# Patient Record
Sex: Female | Born: 1965 | Race: White | Hispanic: No | Marital: Married | Smoking: Never smoker
Health system: Southern US, Community
[De-identification: ages and names within clinical notes are randomized; demographics above are authoritative.]

## PROBLEM LIST (undated history)

## (undated) DIAGNOSIS — I10 Essential (primary) hypertension: Secondary | ICD-10-CM

---

## 2003-12-18 ENCOUNTER — Observation Stay: Payer: Self-pay

## 2003-12-27 ENCOUNTER — Inpatient Hospital Stay: Payer: Self-pay | Admitting: Obstetrics & Gynecology

## 2004-01-11 ENCOUNTER — Observation Stay: Payer: Self-pay

## 2004-01-12 ENCOUNTER — Ambulatory Visit (HOSPITAL_COMMUNITY): Admission: RE | Admit: 2004-01-12 | Discharge: 2004-01-12 | Payer: Self-pay | Admitting: Family Medicine

## 2005-01-22 ENCOUNTER — Ambulatory Visit: Payer: Self-pay

## 2006-01-29 ENCOUNTER — Ambulatory Visit: Payer: Self-pay

## 2006-12-11 ENCOUNTER — Ambulatory Visit: Payer: Self-pay | Admitting: Family Medicine

## 2008-02-06 ENCOUNTER — Emergency Department: Payer: Self-pay | Admitting: Emergency Medicine

## 2009-01-29 ENCOUNTER — Ambulatory Visit: Payer: Self-pay | Admitting: Family Medicine

## 2009-02-01 ENCOUNTER — Ambulatory Visit: Payer: Self-pay | Admitting: Family Medicine

## 2011-01-28 IMAGING — MG MMM DGTL SCREENING MAMMO W/CAD
1 series · 4 of 4 positions shown · non-contrast
Comparison: none

REASON FOR EXAM: scr mammo
COMMENTS:

[Series 591: R CC · right · 4 of 4 slices shown]
[im 1/4]
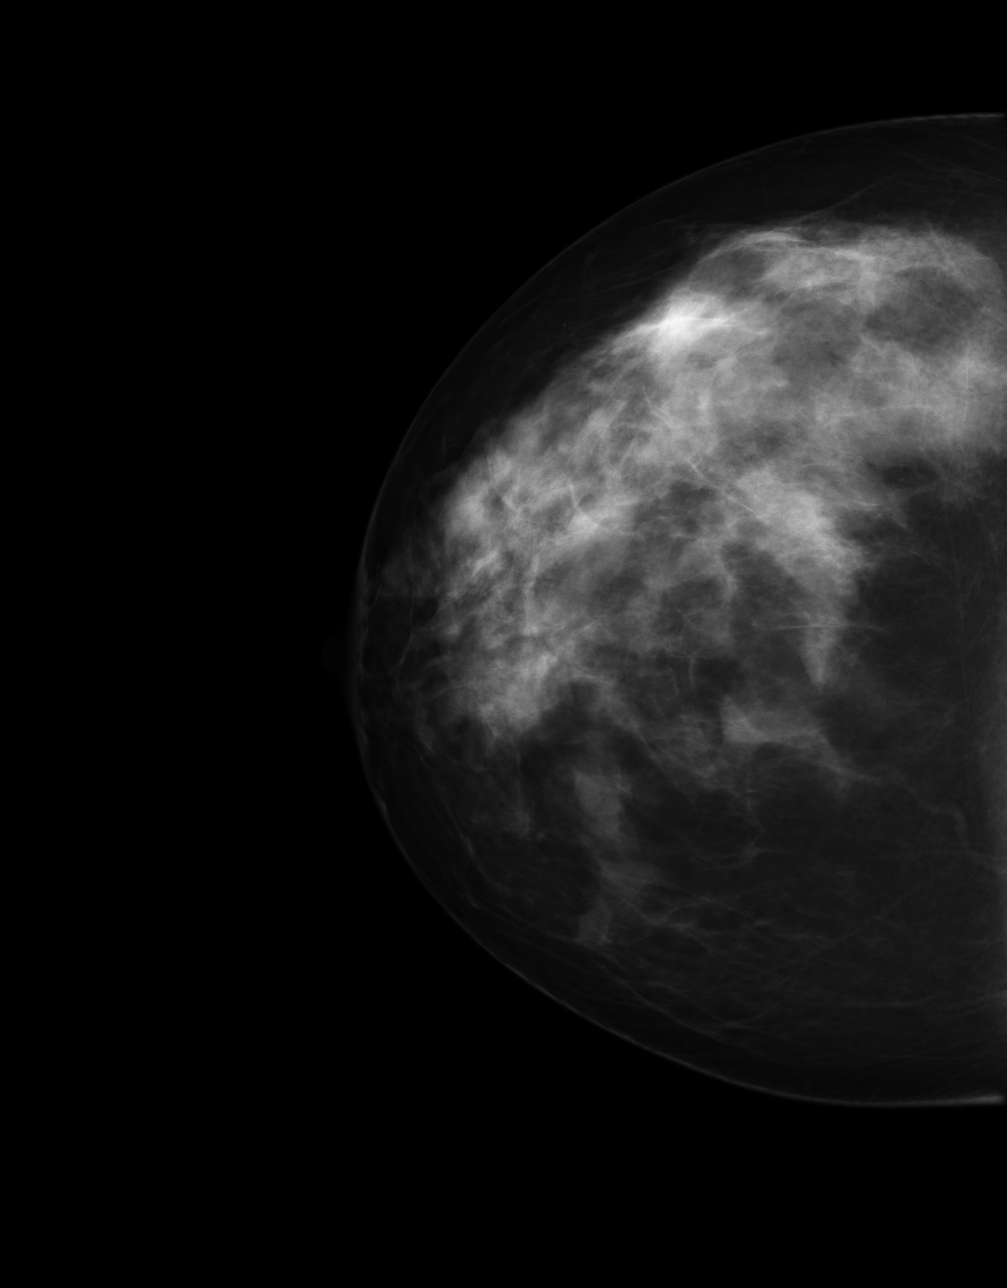
[im 2/4]
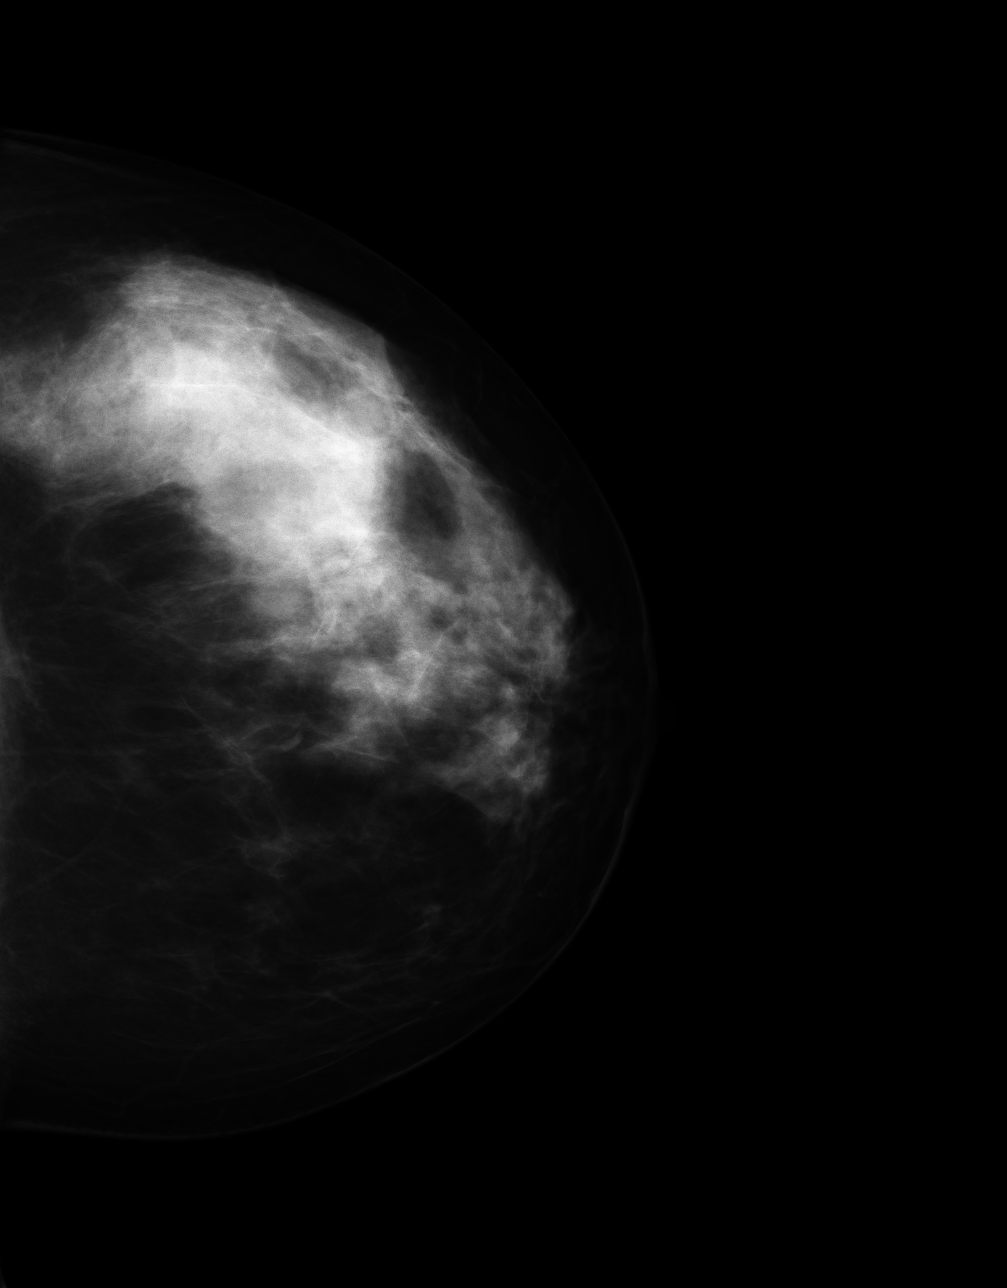
[im 3/4]
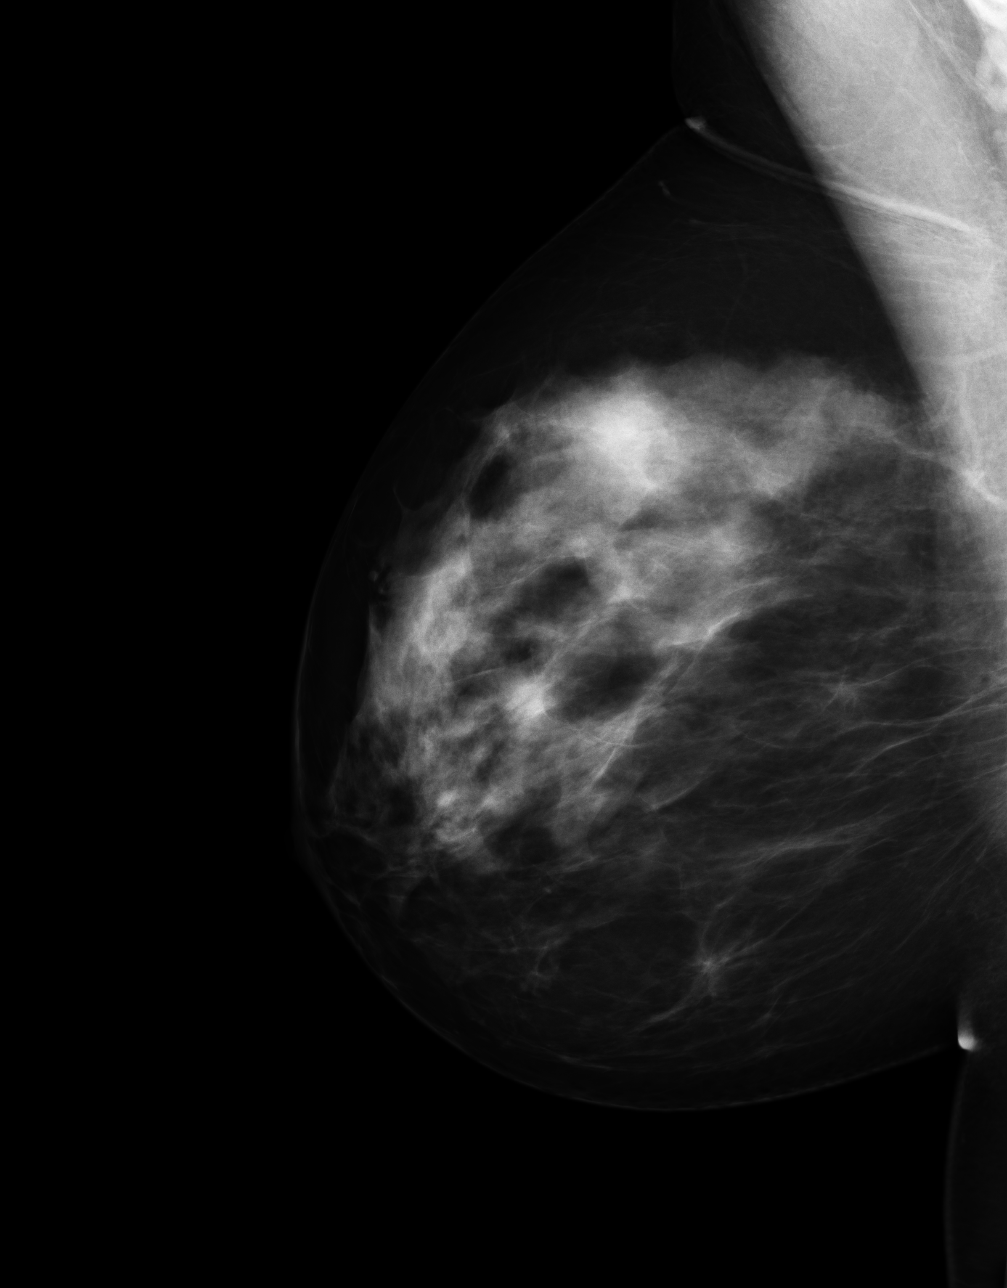
[im 4/4]
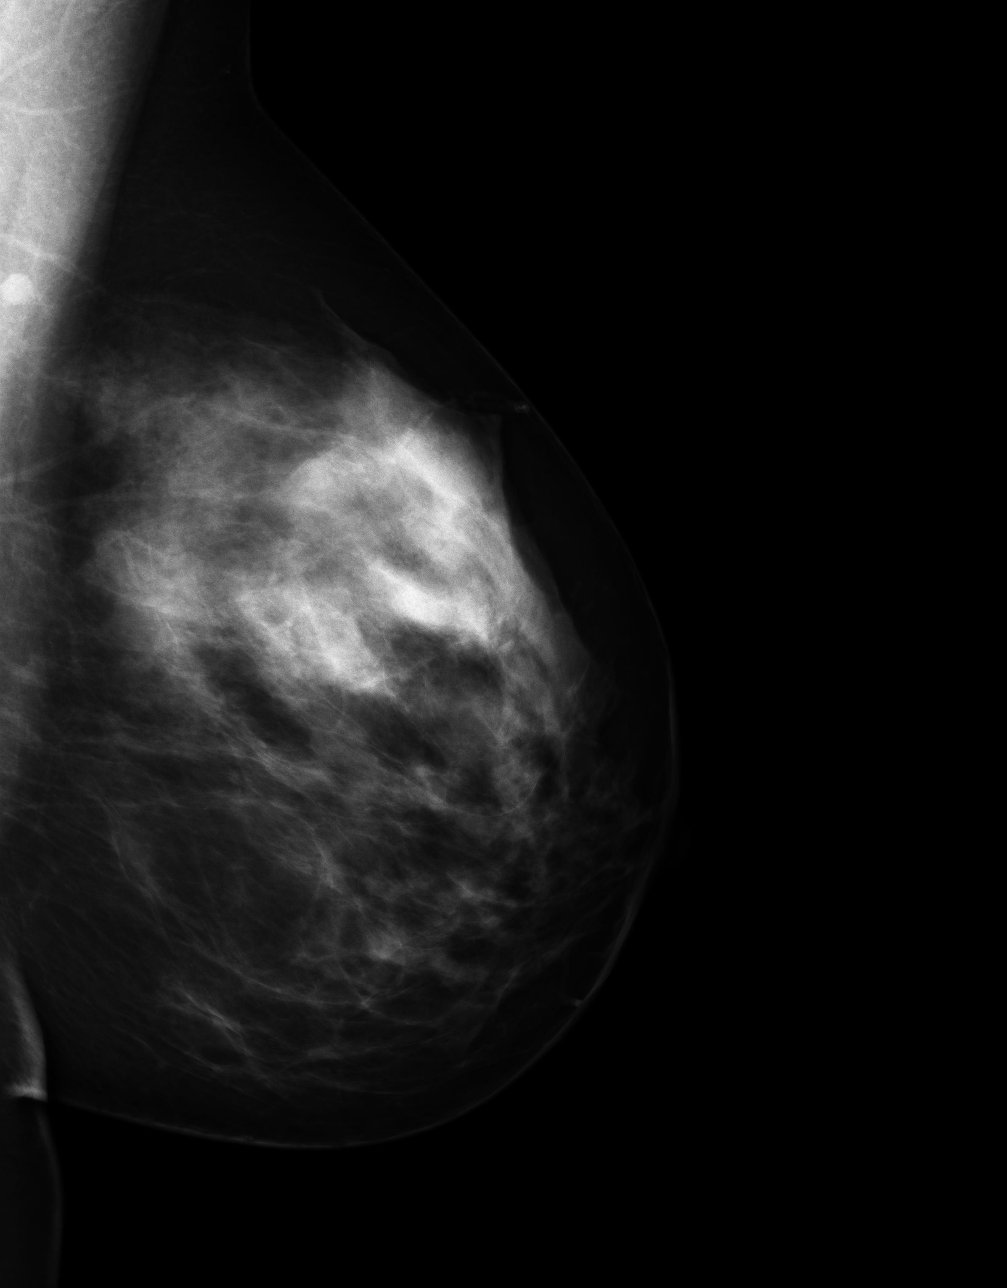

[4 of 4 positions shown; findings below may reference images not displayed]

PROCEDURE:     MMM - MMM DGTL SCREENING MAMMO W/CAD  - January 29, 2009  [DATE]

RESULT:     Comparison is made to a study of 01/29/2006 and 01/22/2005.

The breasts exhibit a dense parenchymal pattern. There is no dominant mass.
I see no malignant appearing groupings of microcalcification. On the right,
there is an area of nodularity that is more conspicuous than in the past,
demonstrated best on the MLO view. This is likely related to changes in
positioning but further evaluation will be needed.
IMPRESSION: I do not see findings highly suspicious for malignancy.
However, there is nodularity present demonstrated best on the MLO view
likely along the equator of the right breast which merits further
evaluation. The areas in question have been marked electronically and the
images saved.

BI-RADS:  Category 0 - Needs Additional Imaging Evaluation. Additional
work-up  of the right breast with supplemental spot compression views and a
true lateral film is recommended. Ultrasound may be indicated if nodularity
persists.

Thank you for this opportunity to contribute to the care of your patient.

A NEGATIVE MAMMOGRAM REPORT DOES NOT PRECLUDE BIOPSY OR OTHER EVALUATION OF
A CLINICALLY PALPABLE OR OTHERWISE SUSPICIOUS MASS OR LESION. BREAST CANCER
MAY NOT BE DETECTED BY MAMMOGRAPHY IN UP TO 10% OF CASES.

## 2013-05-28 ENCOUNTER — Ambulatory Visit: Payer: Self-pay | Admitting: Family Medicine

## 2014-07-12 ENCOUNTER — Encounter: Payer: Self-pay | Admitting: Family Medicine

## 2014-07-12 ENCOUNTER — Ambulatory Visit
Admission: EM | Admit: 2014-07-12 | Discharge: 2014-07-12 | Disposition: A | Discharge status: Home / Self Care | Payer: BC Managed Care – PPO | Attending: Family Medicine | Admitting: Family Medicine

## 2014-07-12 DIAGNOSIS — H8110 Benign paroxysmal vertigo, unspecified ear: Secondary | ICD-10-CM | POA: Diagnosis not present

## 2014-07-12 HISTORY — DX: Essential (primary) hypertension: I10

## 2014-07-12 NOTE — Discharge Instructions (Signed)
Epley Maneuver Self-Care WHAT IS THE EPLEY MANEUVER? The Epley maneuver is an exercise you can do to relieve symptoms of benign paroxysmal positional vertigo (BPPV). This condition is often just referred to as vertigo. BPPV is caused by the movement of tiny crystals (canaliths) inside your inner ear. The accumulation and movement of canaliths in your inner ear causes a sudden spinning sensation (vertigo) when you move your head to certain positions. Vertigo usually lasts about 30 seconds. BPPV usually occurs in just one ear. If you get vertigo when you lie on your left side, you probably have BPPV in your left ear. Your health care provider can tell you which ear is involved.  BPPV may be caused by a head injury. Many people older than 50 get BPPV for unknown reasons. If you have been diagnosed with BPPV, your health care provider may teach you how to do this maneuver. BPPV is not life threatening (benign) and usually goes away in time.  WHEN SHOULD I PERFORM THE EPLEY MANEUVER? You can do this maneuver at home whenever you have symptoms of vertigo. You may do the Epley maneuver up to 3 times a day until your symptoms of vertigo go away. HOW SHOULD I DO THE EPLEY MANEUVER?  Sit on the edge of a bed or table with your back straight. Your legs should be extended or hanging over the edge of the bed or table.   Turn your head halfway toward the affected ear.   Lie backward quickly with your head turned until you are lying flat on your back. You may want to position a pillow under your shoulders.   Hold this position for 30 seconds. You may experience an attack of vertigo. This is normal. Hold this position until the vertigo stops.  Then turn your head to the opposite direction until your unaffected ear is facing the floor.   Hold this position for 30 seconds. You may experience an attack of vertigo. This is normal. Hold this position until the vertigo stops.  Now turn your whole body to the same  side as your head. Hold for another 30 seconds.   You can then sit back up. ARE THERE RISKS TO THIS MANEUVER? In some cases, you may have other symptoms (such as changes in your vision, weakness, or numbness). If you have these symptoms, stop doing the maneuver and call your health care provider. Even if doing these maneuvers relieves your vertigo, you may still have dizziness. Dizziness is the sensation of light-headedness but without the sensation of movement. Even though the Epley maneuver may relieve your vertigo, it is possible that your symptoms will return within 5 years. WHAT SHOULD I DO AFTER THIS MANEUVER? After doing the Epley maneuver, you can return to your normal activities. Ask your doctor if there is anything you should do at home to prevent vertigo. This may include:  Sleeping with two or more pillows to keep your head elevated.  Not sleeping on the side of your affected ear.  Getting up slowly from bed.  Avoiding sudden movements during the day.  Avoiding extreme head movement, like looking up or bending over.  Wearing a cervical collar to prevent sudden head movements. WHAT SHOULD I DO IF MY SYMPTOMS GET WORSE? Call your health care provider if your vertigo gets worse. Call your provider right way if you have other symptoms, including:   Nausea.  Vomiting.  Headache.  Weakness.  Numbness.  Vision changes. Document Released: 02/22/2013 Document Reviewed: 02/22/2013 ExitCare   Patient Information 2015 [employer]. This information is not intended to replace advice given to you by your health care provider. Make sure you discuss any questions you have with your health care provider.   Benign Positional Vertigo Vertigo means you feel like you or your surroundings are moving when they are not. Benign positional vertigo is the most common form of vertigo. Benign means that the cause of your condition is not serious. Benign positional vertigo is more common in  older adults. CAUSES  Benign positional vertigo is the result of an upset in the labyrinth system. This is an area in the middle ear that helps control your balance. This may be caused by a viral infection, head injury, or repetitive motion. However, often no specific cause is found. SYMPTOMS  Symptoms of benign positional vertigo occur when you move your head or eyes in different directions. Some of the symptoms may include:  Loss of balance and falls.  Vomiting.  Blurred vision.  Dizziness.  Nausea.  Involuntary eye movements (nystagmus). DIAGNOSIS  Benign positional vertigo is usually diagnosed by physical exam. If the specific cause of your benign positional vertigo is unknown, your caregiver may perform imaging tests, such as magnetic resonance imaging (MRI) or computed tomography (CT). TREATMENT  Your caregiver may recommend movements or procedures to correct the benign positional vertigo. Medicines such as meclizine, benzodiazepines, and medicines for nausea may be used to treat your symptoms. In rare cases, if your symptoms are caused by certain conditions that affect the inner ear, you may need surgery. HOME CARE INSTRUCTIONS   Follow your caregiver's instructions.  Move slowly. Do not make sudden body or head movements.  Avoid driving.  Avoid operating heavy machinery.  Avoid performing any tasks that would be dangerous to you or others during a vertigo episode.  Drink enough fluids to keep your urine clear or pale yellow. SEEK IMMEDIATE MEDICAL CARE IF:   You develop problems with walking, weakness, numbness, or using your arms, hands, or legs.  You have difficulty speaking.  You develop severe headaches.  Your nausea or vomiting continues or gets worse.  You develop visual changes.  Your family or friends notice any behavioral changes.  Your condition gets worse.  You have a fever.  You develop a stiff neck or sensitivity to light. MAKE SURE YOU:    Understand these instructions.  Will watch your condition.  Will get help right away if you are not doing well or get worse. Document Released: 11/25/2005 Document Revised: 05/12/2011 Document Reviewed: 11/07/2010 ExitCare Patient Information 2015 [employer]. This information is not intended to replace advice given to you by your health care provider. Make sure you discuss any questions you have with your health care provider.  

## 2014-07-12 NOTE — ED Provider Notes (Signed)
CSN: [id number]     Arrival date & time 07/12/14  1428 History   First MD Initiated Contact with Patient 07/12/14 1529     Chief Complaint  Patient presents with  . Dizziness    today  . Nausea    today   (Consider location/radiation/quality/duration/timing/severity/associated sxs/prior Treatment) Patient is a 49 y.o. female presenting with dizziness. The history is provided by the patient.  Dizziness Quality:  Vertigo and lightheadedness Severity:  Mild Onset quality:  Sudden (sudden onset of dizziness today at work; with with movement) Chronicity:  New Context: bending over and head movement   Context: not with bowel movement, not with ear pain, not with eye movement, not with inactivity, not with loss of consciousness, not with medication, not with physical activity, not when standing up and not when urinating   Worsened by:  Movement and turning head Ineffective treatments:  None tried Associated symptoms: vomiting   Associated symptoms: no blood in stool, no chest pain, no diarrhea, no headaches, no nausea, no palpitations, no shortness of breath, no syncope, no tinnitus, no vision changes and no weakness   Risk factors: hx of vertigo     Past Medical History  Diagnosis Date  . Hypertension    Past Surgical History  Procedure Laterality Date  . Cesarean section  2005   Family History  Problem Relation Age of Onset  . Diabetes Mother   . Aortic aneurysm Father   . Hypertension Sister   . Hypertension Brother   . Hypertension Brother    History  Substance Use Topics  . Smoking status: Never Smoker   . Smokeless tobacco: Not on file  . Alcohol Use: No   OB History    Gravida Para Term Preterm AB TAB SAB Ectopic Multiple Living   1 1             Review of Systems  HENT: Negative for tinnitus.   Respiratory: Negative for shortness of breath.   Cardiovascular: Negative for chest pain, palpitations and syncope.  Gastrointestinal: Positive for vomiting. Negative  for nausea, diarrhea and blood in stool.  Neurological: Positive for dizziness. Negative for weakness and headaches.    Allergies  Amoxicillin  Home Medications   Prior to Admission medications   Medication Sig Start Date End Date Taking? Authorizing Provider  hydrochlorothiazide (MICROZIDE) 12.5 MG capsule Take 12.5 mg by mouth daily.   Yes Historical Provider, MD   BP 132/88 mmHg  Pulse 64  Temp(Src) 97.8 F (36.6 C) (Oral)  Resp 18  Ht 5' 9.75" (1.772 m)  Wt 200 lb (90.719 kg)  BMI 28.89 kg/m2  SpO2 98% Physical Exam  Constitutional: She is oriented to person, place, and time. She appears well-developed and well-nourished. No distress.  HENT:  Head: Normocephalic and atraumatic.  Right Ear: Tympanic membrane, external ear and ear canal normal.  Left Ear: Tympanic membrane, external ear and ear canal normal.  Nose: Nose normal.  Mouth/Throat: Oropharynx is clear and moist and mucous membranes are normal.  Eyes: Conjunctivae and EOM are normal. Pupils are equal, round, and reactive to light. Right eye exhibits no discharge. Left eye exhibits no discharge. No scleral icterus.  Neck: Normal range of motion. Neck supple. No JVD present. No tracheal deviation present. No thyromegaly present.  Cardiovascular: Normal rate, regular rhythm, normal heart sounds and intact distal pulses.   No murmur heard. Pulmonary/Chest: Effort normal and breath sounds normal. No stridor. No respiratory distress. She has no wheezes. She has no  rales. She exhibits no tenderness.  Abdominal: Soft. Bowel sounds are normal. She exhibits no distension and no mass. There is no tenderness. There is no rebound and no guarding.  Musculoskeletal: She exhibits no edema or tenderness.  Lymphadenopathy:    She has no cervical adenopathy.  Neurological: She is alert and oriented to person, place, and time. She has normal reflexes. She displays normal reflexes. No cranial nerve deficit. She exhibits normal muscle  tone. Coordination normal.  Positive Hallpike maneuver test  Skin: Skin is warm and dry. No rash noted. She is not diaphoretic. No erythema. No pallor.  Psychiatric: She has a normal mood and affect. Her behavior is normal. Judgment and thought content normal.  Nursing note and vitals reviewed.   ED Course  Procedures (including critical care time) Labs Review Labs Reviewed - No data to display  Imaging Review No results found.   MDM   1. Benign positional vertigo, unspecified laterality     Plan: 1.Diagnosis reviewed with patient 2. rx as per orders; risks, benefits, potential side effects reviewed with patient 3. Recommend vestibular exercises 4. F/u prn if symptoms worsen or don't improve    Payton Mccallumrlando Lalani Winkles, MD 07/12/14 1700

## 2014-07-12 NOTE — ED Notes (Signed)
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 She went to the school nurse and the dizziness has not gone away. She vomited on her way here. She feels the room spinning and when she closes her eyes the room still feels like it is spinning.

## 2015-12-21 ENCOUNTER — Encounter: Payer: Self-pay | Admitting: *Deleted

## 2015-12-21 ENCOUNTER — Ambulatory Visit
Admission: EM | Admit: 2015-12-21 | Discharge: 2015-12-21 | Disposition: A | Discharge status: Home / Self Care | Payer: BC Managed Care – PPO | Attending: Family Medicine | Admitting: Family Medicine

## 2015-12-21 DIAGNOSIS — J069 Acute upper respiratory infection, unspecified: Secondary | ICD-10-CM

## 2015-12-21 MED ORDER — AZITHROMYCIN 250 MG PO TABS: ORAL_TABLET | ORAL | 0 refills | Status: DC

## 2015-12-21 NOTE — ED Triage Notes (Signed)
Runny nose and head congestion x1 week. Sore throat and hoarseness past 2 days. Pt states much worse today.

## 2015-12-21 NOTE — ED Provider Notes (Signed)
CSN: [id number]     Arrival date & time 12/21/15  1747 History   First MD Initiated Contact with Patient 12/21/15 1856     Chief Complaint  Patient presents with  . Sore Throat  . Nasal Congestion  . Otalgia   (Consider location/radiation/quality/duration/timing/severity/associated sxs/prior Treatment) HPI   50 year old female who presents with head congestion and runny nose ,sore throat ,hoarseness and a productive cough she's had for about a week but has worsened over the last 2 days. She has not had any fever but has had chills. She states that today she noticed that she is losing her voice. She states that she usually gets this yearly and despite her best efforts at home remedies seems always get to the point where she has to be seen for more powerful medications.       Past Medical History:  Diagnosis Date  . Hypertension    Past Surgical History:  Procedure Laterality Date  . CESAREAN SECTION  2005   Family History  Problem Relation Age of Onset  . Diabetes Mother   . Aortic aneurysm Father   . Hypertension Sister   . Hypertension Brother   . Hypertension Brother    Social History  Substance Use Topics  . Smoking status: Never Smoker  . Smokeless tobacco: Never Used  . Alcohol use No   OB History    Gravida Para Term Preterm AB Living   1 1           SAB TAB Ectopic Multiple Live Births                 Review of Systems  Constitutional: Positive for chills and fatigue. Negative for appetite change and fever.  HENT: Positive for congestion, ear pain, postnasal drip, rhinorrhea, sore throat and voice change.   Respiratory: Positive for cough. Negative for shortness of breath, wheezing and stridor.   All other systems reviewed and are negative.   Allergies  Amoxicillin  Home Medications   Prior to Admission medications   Medication Sig Start Date End Date Taking? Authorizing Provider  hydrochlorothiazide (MICROZIDE) 12.5 MG capsule Take 12.5 mg by  mouth daily.   Yes Historical Provider, MD  azithromycin (ZITHROMAX Z-PAK) 250 MG tablet Take per package instructions 12/21/15   Lutricia FeilWilliam P Kento Gossman, PA-C   Meds Ordered and Administered this Visit  Medications - No data to display  BP 140/89 (BP Location: Left Arm)   Pulse 87   Temp 98 F (36.7 C) (Oral)   Resp 16   Ht 5\' 10"  (1.778 m)   Wt 200 lb (90.7 kg)   SpO2 98%   BMI 28.70 kg/m  No data found.   Physical Exam  Constitutional: She is oriented to person, place, and time. She appears well-developed and well-nourished. No distress.  HENT:  Head: Normocephalic and atraumatic.  Right Ear: External ear normal.  Left Ear: External ear normal.  Nose: Nose normal.  Mouth/Throat: Oropharynx is clear and moist. No oropharyngeal exudate.  Eyes: EOM are normal. Pupils are equal, round, and reactive to light. Right eye exhibits no discharge. Left eye exhibits no discharge.  Neck: Normal range of motion. Neck supple.  Pulmonary/Chest: Effort normal and breath sounds normal. No respiratory distress. She has no wheezes. She has no rales.  Musculoskeletal: Normal range of motion.  Lymphadenopathy:    She has no cervical adenopathy.  Neurological: She is alert and oriented to person, place, and time.  Skin: Skin is warm and dry.  She is not diaphoretic.  Psychiatric: She has a normal mood and affect. Her behavior is normal. Judgment and thought content normal.  Nursing note and vitals reviewed.   Urgent Care Course   Clinical Course    Procedures (including critical care time)  Labs Review Labs Reviewed - No data to display  Imaging Review No results found.   Visual Acuity Review  Right Eye Distance:   Left Eye Distance:   Bilateral Distance:    Right Eye Near:   Left Eye Near:    Bilateral Near:         MDM   1. Upper respiratory tract infection, unspecified type    Discharge Medication List as of 12/21/2015  7:19 PM    START taking these medications    Details  azithromycin (ZITHROMAX Z-PAK) 250 MG tablet Take per package instructions, Normal      Plan: 1. Test/x-ray results and diagnosis reviewed with patient 2. rx as per orders; risks, benefits, potential side effects reviewed with patient 3. Recommend supportive treatment with Rest increased fluids and Flonase for 2-3 weeks. If she runs a high fever or is not improving within 2 weeks she should follow-up with her primary care physician. 4. F/u prn if symptoms worsen or don't improve     Lutricia Feil, PA-C 12/21/15 2022    Lutricia Feil, PA-C 12/21/15 2024

## 2015-12-26 ENCOUNTER — Telehealth: Payer: Self-pay

## 2015-12-26 NOTE — Telephone Encounter (Signed)
Courtesy call back completed today for patient's recent visit at [facility]. Patient did not answer, left message on machine to call back with any questions or concerns.   

## 2016-04-17 ENCOUNTER — Encounter: Payer: Self-pay | Admitting: *Deleted

## 2016-04-17 ENCOUNTER — Ambulatory Visit
Admission: EM | Admit: 2016-04-17 | Discharge: 2016-04-17 | Disposition: A | Discharge status: Home / Self Care | Payer: BC Managed Care – PPO | Attending: Emergency Medicine | Admitting: Emergency Medicine

## 2016-04-17 DIAGNOSIS — M5416 Radiculopathy, lumbar region: Secondary | ICD-10-CM

## 2016-04-17 DIAGNOSIS — S39012A Strain of muscle, fascia and tendon of lower back, initial encounter: Secondary | ICD-10-CM

## 2016-04-17 LAB — URINALYSIS, COMPLETE (UACMP) WITH MICROSCOPIC
Bilirubin Urine: NEGATIVE
GLUCOSE, UA: NEGATIVE mg/dL
Hgb urine dipstick: NEGATIVE
NITRITE: NEGATIVE
PROTEIN: NEGATIVE mg/dL
RBC / HPF: NONE SEEN RBC/hpf (ref 0–5)
Specific Gravity, Urine: >1.03 — ABNORMAL HIGH (ref 1.005–1.030)
pH: 5.5 (ref 5.0–8.0)

## 2016-04-17 MED ORDER — NAPROXEN 500 MG PO TABS: 500.0000 mg | ORAL_TABLET | Freq: Two times a day (BID) | ORAL | 0 refills | Status: DC

## 2016-04-17 MED ORDER — METAXALONE 800 MG PO TABS: 800.0000 mg | ORAL_TABLET | Freq: Three times a day (TID) | ORAL | 0 refills | Status: DC

## 2016-04-17 NOTE — ED Provider Notes (Signed)
CSN: [id number]     Arrival date & time 04/17/16  [id number] History   First MD Initiated Contact with Patient 04/17/16 [phone]     Chief Complaint  Patient presents with  . Back Pain  . Nausea   (Consider location/radiation/quality/duration/timing/severity/associated sxs/prior Treatment) HPI  This a [age] year old female who presents with pain and numbness indicating the L1-L2 levels with radiation to her right groin just below the inguinal ligament which procedures numbness. He has had some nausea with this as well. Does not remember any specific injury other then 2 days ago when she twisted her head in the car and had the onset of the pain.  For several Weeks she has been moving trees and clearing land with small brush comprising most of the size of the debris.. She's had no incontinence. No weakness in her legs that she is aware of. Also noted is high [facility]. This is higher than what she normally has.      Past Medical History:  Diagnosis Date  . Hypertension    Past Surgical History:  Procedure Laterality Date  . CESAREAN SECTION  2005   Family History  Problem Relation Age of Onset  . Diabetes Mother   . Aortic aneurysm Father   . Hypertension Sister   . Hypertension Brother   . Hypertension Brother    Social History  Substance Use Topics  . Smoking status: Never Smoker  . Smokeless tobacco: Never Used  . Alcohol use No   OB History    Gravida Para Term Preterm AB Living   1 1           SAB TAB Ectopic Multiple Live Births                 Review of Systems  Constitutional: Positive for activity change. Negative for appetite change, chills and fatigue.  Musculoskeletal: Positive for back pain.  All other systems reviewed and are negative.   Allergies  Amoxicillin  Home Medications   Prior to Admission medications   Medication Sig Start Date End Date Taking? Authorizing Provider  hydrochlorothiazide (MICROZIDE)  12.5 MG capsule Take 12.5 mg by mouth daily.   Yes Historical Provider, MD  metaxalone (SKELAXIN) 800 MG tablet Take 1 tablet (800 mg total) by mouth 3 (three) times daily. 04/17/16   Lutricia FeilWilliam P Abdoulie Tierce, PA-C  naproxen (NAPROSYN) 500 MG tablet Take 1 tablet (500 mg total) by mouth 2 (two) times daily with a meal. 04/17/16   Lutricia FeilWilliam P Araseli Sherry, PA-C   Meds Ordered and Administered this Visit  Medications - No data to display  BP (!) 150/90   Pulse 75   Temp 98.1 F (36.7 C) (Oral)   Resp 16   Ht 5\' 10"  (1.778 m)   Wt 200 lb (90.7 kg)   SpO2 98%   BMI 28.70 kg/m  No data found.   Physical Exam  Constitutional: She is oriented to person, place, and time. She appears well-developed and well-nourished. No distress.  HENT:  Head: Normocephalic and atraumatic.  Eyes: EOM are normal. Pupils are equal, round, and reactive to light. Right eye exhibits no discharge. Left eye exhibits no discharge.  Neck: Normal range of motion. Neck supple.  Musculoskeletal: Normal range of motion. She exhibits tenderness.  Examination was performed with Revonda StandardAllison, nursing student as Nurse, children'schaperone and assistant. Patient's pelvis is level in stance. She has noticeable scoliosis. Range of motion of her lumbar spine is full  and comfortable. Thoracic rotation to the left causes mild discomfort on the right paraspinous muscles. Patient is able to toe and heel walk. DTRs are 24 symmetrical. He has 2 beat clonus on the right. EHL peroneal and anterior tibialis are strong to testing. Right hip flexors are mildly weak. CompairedTo the left. He has a hip esthesia to light touch in L1 distribution on the right. Maximum tenderness is localized over the L1-L2 levels on the right paraspinous muscles.  Neurological: She is alert and oriented to person, place, and time. She displays normal reflexes. A sensory deficit is present. She exhibits abnormal muscle tone. Coordination normal.  Skin: Skin is warm and dry. She is not diaphoretic.   Psychiatric: She has a normal mood and affect. Her behavior is normal. Judgment and thought content normal.  Nursing note and vitals reviewed.   Urgent Care Course     Procedures (including critical care time)  Labs Review Labs Reviewed  URINALYSIS, COMPLETE (UACMP) WITH MICROSCOPIC - Abnormal; Notable for the following:       Result Value   Specific Gravity, Urine >1.030 (*)    Ketones, ur TRACE (*)    Leukocytes, UA SMALL (*)    Squamous Epithelial / LPF 0-5 (*)    Bacteria, UA RARE (*)    All other components within normal limits    Imaging Review No results found.   Visual Acuity Review  Right Eye Distance:   Left Eye Distance:   Bilateral Distance:    Right Eye Near:   Left Eye Near:    Bilateral Near:         MDM   1. Strain of lumbar region, initial encounter   2. Right lumbar radiculitis    Discharge Medication List as of 04/17/2016 10:28 AM    START taking these medications   Details  metaxalone (SKELAXIN) 800 MG tablet Take 1 tablet (800 mg total) by mouth 3 (three) times daily., Starting Thu 04/17/2016, Normal    naproxen (NAPROSYN) 500 MG tablet Take 1 tablet (500 mg total) by mouth 2 (two) times daily with a meal., Starting Thu 04/17/2016, Normal      Plan: 1. Test/x-ray results and diagnosis reviewed with patient 2. rx as per orders; risks, benefits, potential side effects reviewed with patient 3. Recommend supportive treatment with Symptom avoidance and rest. He was cautioned regarding the use of Skelaxin with activities requiring judgment and concentration. She should not drive or taking the muscle relaxers. Is reminded to take the Naprosyn with food. If she is not improving she should follow-up with her primary care physician at [facility]. Her blood pressure should be 150/90 at the time of her discharge. Also mentioned this to her primary care at her follow-up. 4. F/u prn if symptoms worsen or don't improve     Lutricia Feil,  PA-C 04/17/16 1040

## 2016-04-17 NOTE — ED Triage Notes (Signed)
Sudden onset LS back pain while seated 2 days ago. Pain radiates to right hip and leg, skin to right thigh is numb. Pt also has nausea with this. Pt has been involved in assisting with clearing land, moving ltree limbs for past several weeks but does not associate any injury with this.

## 2018-05-07 ENCOUNTER — Ambulatory Visit
Admission: EM | Admit: 2018-05-07 | Discharge: 2018-05-07 | Disposition: A | Discharge status: Home / Self Care | Payer: BC Managed Care – PPO | Attending: Internal Medicine | Admitting: Internal Medicine

## 2018-05-07 ENCOUNTER — Other Ambulatory Visit: Payer: Self-pay

## 2018-05-07 ENCOUNTER — Encounter: Payer: Self-pay | Admitting: Emergency Medicine

## 2018-05-07 DIAGNOSIS — J101 Influenza due to other identified influenza virus with other respiratory manifestations: Secondary | ICD-10-CM | POA: Diagnosis not present

## 2018-05-07 LAB — RAPID STREP SCREEN (MED CTR MEBANE ONLY): Streptococcus, Group A Screen (Direct): NEGATIVE

## 2018-05-07 LAB — RAPID INFLUENZA A&B ANTIGENS (ARMC ONLY): INFLUENZA A (ARMC): POSITIVE — AB

## 2018-05-07 LAB — RAPID INFLUENZA A&B ANTIGENS: Influenza B (ARMC): NEGATIVE

## 2018-05-07 MED ORDER — OSELTAMIVIR PHOSPHATE 75 MG PO CAPS: 75.0000 mg | ORAL_CAPSULE | Freq: Two times a day (BID) | ORAL | 0 refills | Status: DC

## 2018-05-07 MED ORDER — FLUTICASONE PROPIONATE 50 MCG/ACT NA SUSP: 1.0000 | Freq: Every day | NASAL | 2 refills | Status: AC

## 2018-05-07 NOTE — ED Provider Notes (Signed)
[facility]    CSN: [id number] Arrival date & time: 05/07/18  1344     History   Chief Complaint No chief complaint on file.   HPI Briana Abbott is a 53 y.o. female with a history of hypertension comes to the urgent care with complaints of nasal congestion, fever and earache of 1 day duration.  Symptoms started fairly rapidly.  She denies any significant sore throat.  She has a cough which is not productive of sputum.  No body aches.  She has some ear pain with some dizziness.  No nausea or vomiting.  No diarrhea.  Patient has a positive family sick contact.   HPI  Past Medical History:  Diagnosis Date  . Hypertension     There are no active problems to display for this patient.   Past Surgical History:  Procedure Laterality Date  . CESAREAN SECTION  2005    OB History    Gravida  1   Para  1   Term      Preterm      AB      Living        SAB      TAB      Ectopic      Multiple      Live Births               Home Medications    Prior to Admission medications   Medication Sig Start Date End Date Taking? Authorizing Provider  hydrochlorothiazide (MICROZIDE) 12.5 MG capsule Take 12.5 mg by mouth daily.   Yes [provider]  valACYclovir (VALTREX) 500 MG tablet Take 500 mg by mouth daily. 04/21/18  Yes [provider]  lisinopril-hydrochlorothiazide (PRINZIDE,ZESTORETIC) 20-25 MG tablet  04/10/18   [provider]  metaxalone (SKELAXIN) 800 MG tablet Take 1 tablet (800 mg total) by mouth 3 (three) times daily. 04/17/16   Lutricia Feil, PA-C  naproxen (NAPROSYN) 500 MG tablet Take 1 tablet (500 mg total) by mouth 2 (two) times daily with a meal. 04/17/16   Lutricia Feil, PA-C    Family History Family History  Problem Relation Age of Onset  . Diabetes Mother   . Aortic aneurysm Father   . Hypertension Sister   . Hypertension Brother   . Hypertension Brother     Social History Social History    Tobacco Use  . Smoking status: Never Smoker  . Smokeless tobacco: Never Used  Substance Use Topics  . Alcohol use: No  . Drug use: Not on file     Allergies   Amoxicillin   Review of Systems Review of Systems  Constitutional: Positive for chills, fatigue and fever. Negative for activity change and appetite change.  HENT: Positive for ear pain and postnasal drip. Negative for ear discharge, hearing loss, nosebleeds, rhinorrhea, sinus pressure and sinus pain.   Respiratory: Negative for chest tightness, shortness of breath and wheezing.   Cardiovascular: Negative for chest pain and palpitations.  Gastrointestinal: Negative for abdominal distention, abdominal pain, nausea and vomiting.  Genitourinary: Negative for dysuria, frequency and urgency.  Musculoskeletal: Positive for myalgias. Negative for arthralgias.  Skin: Negative for rash.  Neurological: Negative for dizziness, weakness and light-headedness.     Physical Exam Triage Vital Signs ED Triage Vitals  Enc Vitals Group     BP 05/07/18 1401 106/62     Pulse Rate 05/07/18 1401 (!) 113     Resp 05/07/18 1401 18  Temp 05/07/18 1401 (!) 100.6 F (38.1 C)     Temp Source 05/07/18 1401 Oral     SpO2 05/07/18 1401 96 %     Weight 05/07/18 1358 190 lb (86.2 kg)     Height 05/07/18 1358 5\' 10"  (1.778 m)     Head Circumference --      Peak Flow --      Pain Score 05/07/18 1357 8     Pain Loc --      Pain Edu? --      Excl. in GC? --    No data found.  Updated Vital Signs BP 106/62 (BP Location: Left Arm)   Pulse (!) 113   Temp (!) 100.6 F (38.1 C) (Oral)   Resp 18   Ht 5\' 10"  (1.778 m)   Wt 86.2 kg   SpO2 96%   BMI 27.26 kg/m   Visual Acuity Right Eye Distance:   Left Eye Distance:   Bilateral Distance:    Right Eye Near:   Left Eye Near:    Bilateral Near:     Physical Exam Constitutional:      Appearance: She is ill-appearing.  HENT:     Right Ear: Tympanic membrane normal.     Left Ear:  Tympanic membrane normal.     Nose: Congestion present.     Mouth/Throat:     Mouth: Mucous membranes are moist.     Pharynx: Posterior oropharyngeal erythema present.  Eyes:     Conjunctiva/sclera: Conjunctivae normal.  Neck:     Musculoskeletal: Normal range of motion. No neck rigidity.  Cardiovascular:     Rate and Rhythm: Normal rate and regular rhythm.     Pulses: Normal pulses.     Heart sounds: Normal heart sounds.  Pulmonary:     Effort: Pulmonary effort is normal.     Breath sounds: Normal breath sounds.  Abdominal:     General: Bowel sounds are normal.     Palpations: Abdomen is soft.  Musculoskeletal: Normal range of motion.        General: No swelling or tenderness.  Lymphadenopathy:     Cervical: No cervical adenopathy.  Skin:    General: Skin is warm and dry.     Capillary Refill: Capillary refill takes less than 2 seconds.     Findings: No bruising, erythema or rash.  Neurological:     General: No focal deficit present.     Mental Status: She is alert and oriented to person, place, and time.      UC Treatments / Results  Labs (all labs ordered are listed, but only abnormal results are displayed) Labs Reviewed  RAPID STREP SCREEN ([facility] ONLY)  RAPID INFLUENZA A&B ANTIGENS ([facility] ONLY)  CULTURE, GROUP A STREP [facility] ([facility]))    EKG None  Radiology No results found.  Procedures Procedures (including critical care time)  Medications Ordered in UC Medications - No data to display  Initial Impression / Assessment and Plan / UC Course  I have reviewed the triage vital signs and the nursing notes.  Pertinent labs & imaging results that were available during my care of the patient were reviewed by me and considered in my medical decision making (see chart for details).     1.  Influenza A infection: Tamiflu 75 mg twice daily for 5 days Tylenol/Motrin for fever/body aches Encourage oral fluid intake  History of hypertension: Continue  antihypertensive medications. Final Clinical Impressions(s) / UC Diagnoses   Final diagnoses:  None   Discharge Instructions   None    ED Prescriptions    None     Controlled Substance Prescriptions [place] Controlled Substance Registry consulted? No   Merrilee Jansky, MD 05/07/18 [phone]

## 2018-05-07 NOTE — ED Triage Notes (Signed)
Pt c/o sore throat, swollen cervical lymph nodes, coughing, nasal congestion, body aches, chills, subjective fever. Started about 2 days ago.

## 2018-05-10 LAB — CULTURE, GROUP A STREP (THRC)

## 2019-10-20 ENCOUNTER — Other Ambulatory Visit: Payer: Self-pay | Admitting: Gerontology

## 2019-10-20 DIAGNOSIS — Z1231 Encounter for screening mammogram for malignant neoplasm of breast: Secondary | ICD-10-CM

## 2020-03-31 ENCOUNTER — Other Ambulatory Visit: Payer: Self-pay

## 2020-03-31 ENCOUNTER — Encounter: Payer: Self-pay | Admitting: Emergency Medicine

## 2020-03-31 ENCOUNTER — Ambulatory Visit
Admission: EM | Admit: 2020-03-31 | Discharge: 2020-03-31 | Disposition: A | Discharge status: Home / Self Care | Payer: HRSA Program | Attending: Family Medicine | Admitting: Family Medicine

## 2020-03-31 DIAGNOSIS — J069 Acute upper respiratory infection, unspecified: Secondary | ICD-10-CM | POA: Diagnosis not present

## 2020-03-31 DIAGNOSIS — R0683 Snoring: Secondary | ICD-10-CM | POA: Diagnosis not present

## 2020-03-31 DIAGNOSIS — R0789 Other chest pain: Secondary | ICD-10-CM | POA: Insufficient documentation

## 2020-03-31 DIAGNOSIS — R0982 Postnasal drip: Secondary | ICD-10-CM | POA: Diagnosis not present

## 2020-03-31 DIAGNOSIS — R0602 Shortness of breath: Secondary | ICD-10-CM | POA: Diagnosis present

## 2020-03-31 DIAGNOSIS — Z20822 Contact with and (suspected) exposure to covid-19: Secondary | ICD-10-CM | POA: Diagnosis not present

## 2020-03-31 NOTE — ED Provider Notes (Signed)
[facility]    CSN: [id number] Arrival date & time: 03/31/20  4098      History   Chief Complaint Chief Complaint  Patient presents with  . Headache  . Nasal Congestion  . Shortness of Breath    HPI Briana Abbott is a 55 y.o. female.   HPI   55 year old female here for evaluation of a headache that started yesterday.  Patient reports that she does not usually get headaches, she took some Advil and the headache resolved.  In addition she has had some chest tightness and shortness of breath and reports that she has felt anxious from time to time.  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  Patient denies fever, runny nose, sore throat, cough, GI complaints, or body aches.  Patient has had her COVID vaccine and her booster shot as well as her flu shot.  Additionally, patient reports that she has woken up several times feeling in a panic like she cannot breathe.  Patient reports that she does snore and her significant other has told her that she has had periods where she stopped breathing.  Patient says sleep apnea runs in her family but she has not had a sleep test.  Past Medical History:  Diagnosis Date  . Hypertension     There are no problems to display for this patient.   Past Surgical History:  Procedure Laterality Date  . CESAREAN SECTION  2005    OB History    Gravida  1   Para  1   Term      Preterm      AB      Living        SAB      IAB      Ectopic      Multiple      Live Births               Home Medications    Prior to Admission medications   Medication Sig Start Date End Date Taking? Authorizing Provider  lisinopril-hydrochlorothiazide (PRINZIDE,ZESTORETIC) 20-25 MG tablet  04/10/18  Yes [provider]  fluticasone (FLONASE) 50 MCG/ACT nasal spray Place 1 spray into both  nostrils daily. 05/07/18   LampteyBritta Mccreedy, MD  oseltamivir (TAMIFLU) 75 MG capsule Take 1 capsule (75 mg total) by mouth every 12 (twelve) hours. 05/07/18   LampteyBritta Mccreedy, MD  valACYclovir (VALTREX) 500 MG tablet Take 500 mg by mouth daily. 04/21/18   [provider]    Family History Family History  Problem Relation Age of Onset  . Diabetes Mother   . Aortic aneurysm Father   . Hypertension Sister   . Hypertension Brother   . Hypertension Brother     Social History Social History   Tobacco Use  . Smoking status: Never Smoker  . Smokeless tobacco: Never Used  Vaping Use  . Vaping Use: Never used  Substance Use Topics  . Alcohol use: No  . Drug use: Never     Allergies   Amoxicillin   Review of Systems Review of Systems  Constitutional: Negative for activity change, appetite change and fever.  HENT: Positive for congestion, ear pain and postnasal drip. Negative for rhinorrhea and sore throat.   Respiratory: Positive for shortness of breath. Negative for cough and wheezing.   Cardiovascular: Negative for chest pain.  Gastrointestinal:  Negative for diarrhea, nausea and vomiting.  Musculoskeletal: Negative for arthralgias and myalgias.  Skin: Negative for rash.  Neurological: Positive for headaches.  Hematological: Negative.   Psychiatric/Behavioral: Negative.      Physical Exam Triage Vital Signs ED Triage Vitals  Enc Vitals Group     BP 03/31/20 0938 (!) 107/52     Pulse Rate 03/31/20 0938 83     Resp 03/31/20 0938 14     Temp 03/31/20 0938 98.3 F (36.8 C)     Temp Source 03/31/20 0938 Oral     SpO2 03/31/20 0938 98 %     Weight 03/31/20 0934 200 lb (90.7 kg)     Height 03/31/20 0934 5\' 10"  (1.778 m)     Head Circumference --      Peak Flow --      Pain Score 03/31/20 0934 2     Pain Loc --      Pain Edu? --      Excl. in GC? --    No data found.  Updated Vital Signs BP (!) 107/52 (BP Location: Left Arm)   Pulse 83   Temp 98.3 F  (36.8 C) (Oral)   Resp 14   Ht 5\' 10"  (1.778 m)   Wt 200 lb (90.7 kg)   SpO2 98%   BMI 28.70 kg/m   Visual Acuity Right Eye Distance:   Left Eye Distance:   Bilateral Distance:    Right Eye Near:   Left Eye Near:    Bilateral Near:     Physical Exam Vitals and nursing note reviewed.  Constitutional:      General: She is not in acute distress.    Appearance: Normal appearance. She is well-developed. She is not ill-appearing.  HENT:     Head: Normocephalic and atraumatic.     Right Ear: Tympanic membrane, ear canal and external ear normal.     Left Ear: Tympanic membrane, ear canal and external ear normal.     Nose: Congestion present. No rhinorrhea.     Comments: Nasal mucosa is mildly edematous and erythematous without discharge.    Mouth/Throat:     Mouth: Mucous membranes are moist.     Pharynx: Oropharynx is clear. No oropharyngeal exudate or posterior oropharyngeal erythema.  Cardiovascular:     Rate and Rhythm: Normal rate and regular rhythm.     Pulses: Normal pulses.     Heart sounds: Normal heart sounds. No murmur heard. No gallop.   Pulmonary:     Effort: Pulmonary effort is normal.     Breath sounds: Normal breath sounds. No wheezing, rhonchi or rales.  Musculoskeletal:     Cervical back: Normal range of motion and neck supple.  Lymphadenopathy:     Cervical: No cervical adenopathy.  Skin:    General: Skin is warm and dry.     Capillary Refill: Capillary refill takes less than 2 seconds.     Findings: No erythema or rash.  Neurological:     General: No focal deficit present.     Mental Status: She is alert and oriented to person, place, and time.  Psychiatric:        Mood and Affect: Mood normal.        Behavior: Behavior normal.        Thought Content: Thought content normal.        Judgment: Judgment normal.      UC Treatments / Results  Labs (all labs ordered are listed, but only abnormal  results are displayed) Labs Reviewed  SARS  CORONAVIRUS 2 (TAT 6-24 HRS)    EKG   Radiology No results found.  Procedures Procedures (including critical care time)  Medications Ordered in UC Medications - No data to display  Initial Impression / Assessment and Plan / UC Course  I have reviewed the triage vital signs and the nursing notes.  Pertinent labs & imaging results that were available during my care of the patient were reviewed by me and considered in my medical decision making (see chart for details).   Patient is here for evaluation of multiple complaints.  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  Patient was concerned because she had a headache last night and she does not typically get headaches.  The headache resolved with Advil and has not returned.  Patient has had some nasal congestion, postnasal drip, right ear pressure, and also complaining of some chest tightness on the right and some shortness of breath.  Patient's lung sounds are clear to auscultation in all fields.  There is some erythema and edema of the nasal mucosa but otherwise her upper respiratory tree is benign.  When discussing her shortness of breath further patient reports that she is woken up from sleep several times feeling like she cannot breathe.  She thought she was having a panic attack.  Patient states that she sits up starts to breathe and then feels very very anxious.  Patient does report both snoring and her significant other telling her that she has stopped breathing.  Patient has a history of sleep apnea in her family but is never had a sleep study.  Will swab patient for COVID secondary to her exposure.  Patient symptoms are more consistent with sleep apnea.  I discussed my concerns with patient and have recommended that she make an appointment to speak to her primary care provider about a referral for a sleep study.  EKG performed at triage shows  normal sinus rhythm without ectopy or ST elevation.  Ventricular rate is 71, PR interval is 126, QRS durations 102.  When compared to patient's previous EKG from 08/15/2014 it is unchanged.   Final Clinical Impressions(s) / UC Diagnoses   Final diagnoses:  Upper respiratory tract infection, unspecified type  Shortness of breath     Discharge Instructions     Isolate at home until the results of your Covid test are back.  If your test is positive then you will have to quarantine for 5 days from the start of your symptoms.  After 5 days you can break quarantine if your symptoms have improved and you have not had a fever for 24 hours.  Use over-the-counter Tylenol and ibuprofen as needed for fever and pain.  If you develop worsening shortness of breath-especially at rest, you are unable to take in a full breath, you are unable to speak in full sentences, or is a late sign your lips start turning blue you to go the ER for evaluation.  Your symptoms are also consistent with sleep apnea.  You need to make an appointment to speak to your primary care provider about getting a referral for a sleep study.    ED Prescriptions    None     PDMP not reviewed this encounter.   Becky Augusta, NP 03/31/20 1014

## 2020-03-31 NOTE — ED Triage Notes (Signed)
Patient reports HAs that started yesterday.  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  Patient reports last night she had some tightness in her chest, SOB and felt anxious but then went away.  Patient reports some congestion her chest and head.  Patient denies fevers.

## 2020-03-31 NOTE — Discharge Instructions (Addendum)
Isolate at home until the results of your Covid test are back.  If your test is positive then you will have to quarantine for 5 days from the start of your symptoms.  After 5 days you can break quarantine if your symptoms have improved and you have not had a fever for 24 hours.  Use over-the-counter Tylenol and ibuprofen as needed for fever and pain.  If you develop worsening shortness of breath-especially at rest, you are unable to take in a full breath, you are unable to speak in full sentences, or is a late sign your lips start turning blue you to go the ER for evaluation.  Your symptoms are also consistent with sleep apnea.  You need to make an appointment to speak to your primary care provider about getting a referral for a sleep study.

## 2020-04-01 LAB — SARS CORONAVIRUS 2 (TAT 6-24 HRS): SARS Coronavirus 2: NEGATIVE

## 2022-01-14 ENCOUNTER — Ambulatory Visit
Admission: EM | Admit: 2022-01-14 | Discharge: 2022-01-14 | Disposition: A | Discharge status: Home / Self Care | Payer: BC Managed Care – PPO | Attending: Family Medicine | Admitting: Family Medicine

## 2022-01-14 DIAGNOSIS — J40 Bronchitis, not specified as acute or chronic: Secondary | ICD-10-CM

## 2022-01-14 MED ORDER — BENZONATATE 100 MG PO CAPS: 100.0000 mg | ORAL_CAPSULE | Freq: Three times a day (TID) | ORAL | 0 refills | Status: DC

## 2022-01-14 MED ORDER — AZITHROMYCIN 250 MG PO TABS: 250.0000 mg | ORAL_TABLET | Freq: Every day | ORAL | 0 refills | Status: DC

## 2022-01-14 NOTE — ED Triage Notes (Signed)
Pt c/o Congestion, cough, ear pain, nasal congestion x1-2weeks  Pt was seen by here PCP on Wednesday 01/08/2022 and was tested for covid and flu and it was negative. Pt was given an inhaler.   Pt states that she feels discomfort in the glands of her neck and pressure in her chest and ears.

## 2022-01-14 NOTE — ED Provider Notes (Signed)
[facility]    CSN: [id number] Arrival date & time: 01/14/22  0813      History   Chief Complaint Chief Complaint  Patient presents with   Nasal Congestion   Cough    HPI Briana Abbott is a 56 y.o. female.   Patient is here for URI symptoms for about 10 days.  Last week went to her pcp.  Covid/flu was negative.  Was given an inhaler and flonase for her symptoms, but not improving.  She is having swollen glands, + coughing.  At times dry, other times wet.  Some post-tussive emesis.  No sore throat.  + runny nose, congestion.  She has had PND for a while.  She had a low grade temp last week.        Past Medical History:  Diagnosis Date   Hypertension     There are no problems to display for this patient.   Past Surgical History:  Procedure Laterality Date   CESAREAN SECTION  2005    OB History     Gravida  1   Para  1   Term      Preterm      AB      Living         SAB      IAB      Ectopic      Multiple      Live Births               Home Medications    Prior to Admission medications   Medication Sig Start Date End Date Taking? Authorizing Provider  albuterol (VENTOLIN HFA) 108 (90 Base) MCG/ACT inhaler Inhale into the lungs. 01/08/22  Yes [provider]  fluticasone (FLONASE) 50 MCG/ACT nasal spray Place 1 spray into both nostrils daily. 05/07/18  Yes Lamptey, Britta Mccreedy, MD  lisinopril-hydrochlorothiazide (PRINZIDE,ZESTORETIC) 20-25 MG tablet  04/10/18  Yes [provider]  valACYclovir (VALTREX) 500 MG tablet Take 500 mg by mouth daily. 04/21/18  Yes [provider]    Family History Family History  Problem Relation Age of Onset   Diabetes Mother    Aortic aneurysm Father    Hypertension Sister    Hypertension Brother    Hypertension Brother     Social History Social History   Tobacco Use   Smoking status: Never   Smokeless tobacco: Never  Vaping Use   Vaping Use: Never used   Substance Use Topics   Alcohol use: No   Drug use: Never     Allergies   Amoxicillin   Review of Systems Review of Systems  Constitutional: Negative.  Negative for chills and fever.  HENT:  Positive for congestion and rhinorrhea.   Respiratory:  Positive for cough. Negative for wheezing.   Gastrointestinal: Negative.   Genitourinary: Negative.   Musculoskeletal: Negative.      Physical Exam Triage Vital Signs ED Triage Vitals  Enc Vitals Group     BP 01/14/22 0855 112/78     Pulse Rate 01/14/22 0855 84     Resp 01/14/22 0855 18     Temp 01/14/22 0855 98.8 F (37.1 C)     Temp Source 01/14/22 0855 Oral     SpO2 01/14/22 0855 96 %     Weight 01/14/22 0853 200 lb (90.7 kg)     Height 01/14/22 0853 5\' 10"  (1.778 m)     Head Circumference --      Peak Flow --  Pain Score 01/14/22 0852 5     Pain Loc --      Pain Edu? --      Excl. in GC? --    No data found.  Updated Vital Signs BP 112/78 (BP Location: Left Arm)   Pulse 84   Temp 98.8 F (37.1 C) (Oral)   Resp 18   Ht 5\' 10"  (1.778 m)   Wt 90.7 kg   SpO2 96%   BMI 28.70 kg/m   Visual Acuity Right Eye Distance:   Left Eye Distance:   Bilateral Distance:    Right Eye Near:   Left Eye Near:    Bilateral Near:     Physical Exam Constitutional:      Appearance: Normal appearance. She is ill-appearing.  HENT:     Nose: Congestion present.     Mouth/Throat:     Mouth: Mucous membranes are moist.     Pharynx: Posterior oropharyngeal erythema present. No oropharyngeal exudate.  Cardiovascular:     Rate and Rhythm: Normal rate and regular rhythm.  Pulmonary:     Effort: Pulmonary effort is normal.     Breath sounds: Normal breath sounds. No wheezing.  Musculoskeletal:        General: Normal range of motion.     Cervical back: Normal range of motion and neck supple. No tenderness.  Lymphadenopathy:     Cervical: No cervical adenopathy.  Skin:    General: Skin is warm.  Neurological:      General: No focal deficit present.     Mental Status: She is alert.  Psychiatric:        Mood and Affect: Mood normal.      UC Treatments / Results  Labs (all labs ordered are listed, but only abnormal results are displayed) Labs Reviewed - No data to display  EKG   Radiology No results found.  Procedures Procedures (including critical care time)  Medications Ordered in UC Medications - No data to display  Initial Impression / Assessment and Plan / UC Course  I have reviewed the triage vital signs and the nursing notes.  Pertinent labs & imaging results that were available during my care of the patient were reviewed by me and considered in my medical decision making (see chart for details).   Final Clinical Impressions(s) / UC Diagnoses   Final diagnoses:  Bronchitis     Discharge Instructions      You were seen today for upper respiratory symptoms.  I have sent out a zpack and tessalon to help you today.  You may continue over the counter medications and inhaler if you find them helpful.  Please return or follow up with your PCP if not improving as expected.     ED Prescriptions     Medication Sig Dispense Auth. Provider   azithromycin (ZITHROMAX) 250 MG tablet Take 1 tablet (250 mg total) by mouth daily. Take first 2 tablets together, then 1 every day until finished. 6 tablet Maite Burlison, MD   benzonatate (TESSALON) 100 MG capsule Take 1 capsule (100 mg total) by mouth every 8 (eight) hours. 21 capsule , MD      PDMP not reviewed this encounter.   Jannifer Franklin, MD 01/14/22 [phone]

## 2022-01-14 NOTE — Discharge Instructions (Addendum)
You were seen today for upper respiratory symptoms.  I have sent out a zpack and tessalon to help you today.  You may continue over the counter medications and inhaler if you find them helpful.  Please return or follow up with your PCP if not improving as expected.

## 2022-02-19 ENCOUNTER — Other Ambulatory Visit: Payer: Self-pay | Admitting: Gerontology

## 2022-02-19 DIAGNOSIS — Z1231 Encounter for screening mammogram for malignant neoplasm of breast: Secondary | ICD-10-CM

## 2022-09-03 ENCOUNTER — Ambulatory Visit: Payer: BC Managed Care – PPO

## 2022-09-03 DIAGNOSIS — K640 First degree hemorrhoids: Secondary | ICD-10-CM

## 2022-09-03 DIAGNOSIS — Z1211 Encounter for screening for malignant neoplasm of colon: Secondary | ICD-10-CM | POA: Diagnosis not present

## 2022-09-03 DIAGNOSIS — Z86010 Personal history of colonic polyps: Secondary | ICD-10-CM

## 2022-09-03 DIAGNOSIS — D123 Benign neoplasm of transverse colon: Secondary | ICD-10-CM

## 2022-09-03 DIAGNOSIS — K6389 Other specified diseases of intestine: Secondary | ICD-10-CM

## 2022-09-11 ENCOUNTER — Other Ambulatory Visit: Payer: Self-pay | Admitting: Orthopedic Surgery

## 2022-09-11 DIAGNOSIS — S83242A Other tear of medial meniscus, current injury, left knee, initial encounter: Secondary | ICD-10-CM

## 2022-09-11 DIAGNOSIS — M25562 Pain in left knee: Secondary | ICD-10-CM

## 2022-09-19 ENCOUNTER — Encounter: Payer: Self-pay | Admitting: Orthopedic Surgery

## 2022-09-20 ENCOUNTER — Ambulatory Visit
Admission: RE | Admit: 2022-09-20 | Discharge: 2022-09-20 | Disposition: A | Discharge status: Home / Self Care | Payer: BC Managed Care – PPO | Source: Ambulatory Visit | Attending: Orthopedic Surgery | Admitting: Orthopedic Surgery

## 2022-09-20 DIAGNOSIS — S83242A Other tear of medial meniscus, current injury, left knee, initial encounter: Secondary | ICD-10-CM

## 2022-09-20 DIAGNOSIS — M25562 Pain in left knee: Secondary | ICD-10-CM

## 2023-03-29 ENCOUNTER — Encounter: Payer: Self-pay | Admitting: Emergency Medicine

## 2023-03-29 ENCOUNTER — Ambulatory Visit
Admission: EM | Admit: 2023-03-29 | Discharge: 2023-03-29 | Disposition: A | Discharge status: Home / Self Care | Payer: 59 | Attending: Emergency Medicine | Admitting: Emergency Medicine

## 2023-03-29 DIAGNOSIS — J020 Streptococcal pharyngitis: Secondary | ICD-10-CM | POA: Diagnosis present

## 2023-03-29 DIAGNOSIS — J069 Acute upper respiratory infection, unspecified: Secondary | ICD-10-CM | POA: Insufficient documentation

## 2023-03-29 LAB — GROUP A STREP BY PCR: Group A Strep by PCR: DETECTED — AB

## 2023-03-29 MED ORDER — BENZONATATE 100 MG PO CAPS: 200.0000 mg | ORAL_CAPSULE | Freq: Three times a day (TID) | ORAL | 0 refills | Status: AC

## 2023-03-29 MED ORDER — AZITHROMYCIN 250 MG PO TABS: 250.0000 mg | ORAL_TABLET | Freq: Every day | ORAL | 0 refills | Status: AC

## 2023-03-29 MED ORDER — PROMETHAZINE-DM 6.25-15 MG/5ML PO SYRP: 5.0000 mL | ORAL_SOLUTION | Freq: Four times a day (QID) | ORAL | 0 refills | Status: AC | PRN

## 2023-03-29 MED ORDER — IPRATROPIUM BROMIDE 0.06 % NA SOLN: 2.0000 | Freq: Four times a day (QID) | NASAL | 12 refills | Status: AC

## 2023-03-29 NOTE — ED Triage Notes (Signed)
Patient c/o cough, fever, bodyaches, and headaches that started on Thursday.  Patient reports difficulty swallowing.  Patient tested negative for COVID and Flu at [facility] on Friday.  Patient was prescribed Tamiflu and has been taking it.

## 2023-03-29 NOTE — ED Provider Notes (Signed)
[facility]    CSN: [id number] Arrival date & time: 03/29/23  2130      History   Chief Complaint Chief Complaint  Patient presents with   Cough   Fever    HPI Briana Abbott is a 58 y.o. female.   HPI  58 year old female with past medical history significant for hypertension for evaluation of respiratory symptoms that started 3 days ago.  She was seen at [facility] clinic 2 days ago where she tested negative for COVID and flu.  She was complaining of a sore throat at that time but she was not tested for strep.  Her daughter was strep +2 weeks ago prior to returning to college.  She states that she is continuing to run fevers of 100 with antipyretics on board.  She also has headache, body aches, and a nonproductive cough.  Ear fullness with some decreased appetite and nausea is also present.  No shortness of breath or wheezing and no vomiting or diarrhea.  Past Medical History:  Diagnosis Date   Hypertension     There are no active problems to display for this patient.   Past Surgical History:  Procedure Laterality Date   CESAREAN SECTION  2005    OB History     Gravida  1   Para  1   Term      Preterm      AB      Living         SAB      IAB      Ectopic      Multiple      Live Births               Home Medications    Prior to Admission medications   Medication Sig Start Date End Date Taking? Authorizing Provider  azithromycin (ZITHROMAX Z-PAK) 250 MG tablet Take 1 tablet (250 mg total) by mouth daily. Take 2 tablets on the first day and then 1 tablet daily thereafter for a total of 5 days of treatment. 03/29/23  Yes Becky Augusta, NP  benzonatate (TESSALON) 100 MG capsule Take 2 capsules (200 mg total) by mouth every 8 (eight) hours. 03/29/23  Yes Becky Augusta, NP  ipratropium (ATROVENT) 0.06 % nasal spray Place 2 sprays into both nostrils 4 (four) times daily. 03/29/23  Yes Becky Augusta, NP  lisinopril-hydrochlorothiazide  (PRINZIDE,ZESTORETIC) 20-25 MG tablet  04/10/18  Yes [provider]  promethazine-dextromethorphan (PROMETHAZINE-DM) 6.25-15 MG/5ML syrup Take 5 mLs by mouth 4 (four) times daily as needed. 03/29/23  Yes Becky Augusta, NP  traZODone (DESYREL) 50 MG tablet Take 50 mg by mouth at bedtime. 11/27/16  Yes [provider]  albuterol (VENTOLIN HFA) 108 (90 Base) MCG/ACT inhaler Inhale into the lungs. 01/08/22   [provider]  fluticasone (FLONASE) 50 MCG/ACT nasal spray Place 1 spray into both nostrils daily. 05/07/18   LampteyBritta Mccreedy, MD  valACYclovir (VALTREX) 500 MG tablet Take 500 mg by mouth daily. 04/21/18   [provider]    Family History Family History  Problem Relation Age of Onset   Diabetes Mother    Aortic aneurysm Father    Hypertension Sister    Hypertension Brother    Hypertension Brother     Social History Social History   Tobacco Use   Smoking status: Never   Smokeless tobacco: Never  Vaping Use   Vaping status: Never Used  Substance Use Topics   Alcohol use: No  Drug use: Never     Allergies   Amoxicillin   Review of Systems Review of Systems  Constitutional:  Positive for fever.  HENT:  Positive for congestion, ear pain, rhinorrhea and sore throat.   Respiratory:  Positive for cough. Negative for shortness of breath and wheezing.   Gastrointestinal:  Positive for nausea. Negative for diarrhea and vomiting.  Musculoskeletal:  Positive for arthralgias and myalgias.  Neurological:  Positive for headaches.     Physical Exam Triage Vital Signs ED Triage Vitals  Encounter Vitals Group     BP 03/29/23 0827 104/78     Systolic BP Percentile --      Diastolic BP Percentile --      Pulse Rate 03/29/23 0827 96     Resp 03/29/23 0827 15     Temp 03/29/23 0827 98.7 F (37.1 C)     Temp Source 03/29/23 0827 Oral     SpO2 03/29/23 0827 100 %     Weight 03/29/23 0825 199 lb 15.3 oz (90.7 kg)     Height 03/29/23 0825 5\' 10"   (1.778 m)     Head Circumference --      Peak Flow --      Pain Score 03/29/23 0825 7     Pain Loc --      Pain Education --      Exclude from Growth Chart --    No data found.  Updated Vital Signs BP 104/78 (BP Location: Left Arm)   Pulse 96   Temp 98.7 F (37.1 C) (Oral)   Resp 15   Ht 5\' 10"  (1.778 m)   Wt 199 lb 15.3 oz (90.7 kg)   SpO2 100%   BMI 28.69 kg/m   Visual Acuity Right Eye Distance:   Left Eye Distance:   Bilateral Distance:    Right Eye Near:   Left Eye Near:    Bilateral Near:     Physical Exam Vitals and nursing note reviewed.  Constitutional:      Appearance: Normal appearance. She is ill-appearing.  HENT:     Head: Normocephalic and atraumatic.     Right Ear: Tympanic membrane, ear canal and external ear normal. There is no impacted cerumen.     Left Ear: Tympanic membrane, ear canal and external ear normal. There is no impacted cerumen.     Nose: Congestion and rhinorrhea present.     Comments: Nasal mucosa is erythematous and edematous with thick yellow discharge in both nares.    Mouth/Throat:     Mouth: Mucous membranes are moist.     Pharynx: Oropharynx is clear. Posterior oropharyngeal erythema present. No oropharyngeal exudate.     Comments: Soft palate and tonsillar pillars are erythematous and 1+ edematous.  No appreciable exudate. Neck:     Comments: Bilateral anterior, tender, cervical lymphadenopathy present. Cardiovascular:     Rate and Rhythm: Normal rate and regular rhythm.     Pulses: Normal pulses.     Heart sounds: Normal heart sounds. No murmur heard.    No friction rub. No gallop.  Pulmonary:     Effort: Pulmonary effort is normal.     Breath sounds: Normal breath sounds. No wheezing, rhonchi or rales.  Musculoskeletal:     Cervical back: Normal range of motion and neck supple. Tenderness present.  Lymphadenopathy:     Cervical: Cervical adenopathy present.  Skin:    General: Skin is warm and dry.     Capillary  Refill: Capillary refill takes  less than 2 seconds.     Findings: No rash.  Neurological:     General: No focal deficit present.     Mental Status: She is alert and oriented to person, place, and time.      UC Treatments / Results  Labs (all labs ordered are listed, but only abnormal results are displayed) Labs Reviewed  GROUP A STREP BY PCR - Abnormal; Notable for the following components:      Result Value   Group A Strep by PCR DETECTED (*)    All other components within normal limits    EKG   Radiology No results found.  Procedures Procedures (including critical care time)  Medications Ordered in UC Medications - No data to display  Initial Impression / Assessment and Plan / UC Course  I have reviewed the triage vital signs and the nursing notes.  Pertinent labs & imaging results that were available during my care of the patient were reviewed by me and considered in my medical decision making (see chart for details).   Patient is a very pleasant, though mildly ill-appearing, 58 year old female presenting for evaluation of worsening respiratory symptoms as outlined HPI above.  Her most significant symptom is a sore throat and she does have erythema and edema to her soft palate and tonsillar pillars but no appreciable exudate.  There is anterior cervical lymphadenopathy that is tender to palpation.  Her cardiopulmonary exam reveals clear lung sounds all fields.  Given her distant strep exposure coupled with her fever and sore throat I will order a strep PCR.  Strep PCR is positive.  I will discharge patient home on azithromycin as she has an allergy to amoxicillin.  I will also prescribe Atrovent nasal spray to help with the nasal congestion and Tessalon Perles and Promethazine DM cough syrup for cough and congestion.  Return precautions reviewed.  Work note provided.   Final Clinical Impressions(s) / UC Diagnoses   Final diagnoses:  Strep pharyngitis  URI with cough  and congestion     Discharge Instructions      Take the Azithromycin daily for 5 days for treatment of your strep throat.  Gargle with warm salt water 2-3 times a day to soothe your throat, aid in pain relief, and aid in healing.  Take over-the-counter Tylenol and/or ibuprofen according to the package instructions as needed for pain.  You can also use Chloraseptic or Sucrets lozenges, 1 lozenge every 2 hours as needed for throat pain.  Use the Atrovent nasal spray, 2 squirts in each nostril every 6 hours, as needed for runny nose and postnasal drip.  Use the Tessalon Perles every 8 hours during the day.  Take them with a small sip of water.  They may give you some numbness to the base of your tongue or a metallic taste in your mouth, this is normal.  Use the Promethazine DM cough syrup at bedtime for cough and congestion.  It will make you drowsy so do not take it during the day.  Return for reevaluation or see your primary care provider for any new or worsening symptoms.      ED Prescriptions     Medication Sig Dispense Auth. Provider   azithromycin (ZITHROMAX Z-PAK) 250 MG tablet Take 1 tablet (250 mg total) by mouth daily. Take 2 tablets on the first day and then 1 tablet daily thereafter for a total of 5 days of treatment. 6 tablet Becky Augusta, NP   benzonatate (TESSALON) 100 MG capsule  Take 2 capsules (200 mg total) by mouth every 8 (eight) hours. 21 capsule Becky Augusta, NP   ipratropium (ATROVENT) 0.06 % nasal spray Place 2 sprays into both nostrils 4 (four) times daily. 15 mL Becky Augusta, NP   promethazine-dextromethorphan (PROMETHAZINE-DM) 6.25-15 MG/5ML syrup Take 5 mLs by mouth 4 (four) times daily as needed. 118 mL Becky Augusta, NP      PDMP not reviewed this encounter.   Becky Augusta, NP 03/29/23 [phone]

## 2023-03-29 NOTE — Discharge Instructions (Addendum)
Take the Azithromycin daily for 5 days for treatment of your strep throat.  Gargle with warm salt water 2-3 times a day to soothe your throat, aid in pain relief, and aid in healing.  Take over-the-counter Tylenol and/or ibuprofen according to the package instructions as needed for pain.  You can also use Chloraseptic or Sucrets lozenges, 1 lozenge every 2 hours as needed for throat pain.  Use the Atrovent nasal spray, 2 squirts in each nostril every 6 hours, as needed for runny nose and postnasal drip.  Use the Tessalon Perles every 8 hours during the day.  Take them with a small sip of water.  They may give you some numbness to the base of your tongue or a metallic taste in your mouth, this is normal.  Use the Promethazine DM cough syrup at bedtime for cough and congestion.  It will make you drowsy so do not take it during the day.  Return for reevaluation or see your primary care provider for any new or worsening symptoms.

## 2024-01-14 ENCOUNTER — Other Ambulatory Visit: Payer: Self-pay | Admitting: Gerontology

## 2024-01-14 DIAGNOSIS — Z1231 Encounter for screening mammogram for malignant neoplasm of breast: Secondary | ICD-10-CM
